# Patient Record
Sex: Female | Born: 1950 | Race: White | Hispanic: No | Smoking: Former smoker
Health system: Southern US, Community
[De-identification: ages and names within clinical notes are randomized; demographics above are authoritative.]

## PROBLEM LIST (undated history)

## (undated) DIAGNOSIS — K5909 Other constipation: Secondary | ICD-10-CM

## (undated) DIAGNOSIS — C50919 Malignant neoplasm of unspecified site of unspecified female breast: Secondary | ICD-10-CM

## (undated) DIAGNOSIS — F329 Major depressive disorder, single episode, unspecified: Secondary | ICD-10-CM

## (undated) DIAGNOSIS — Z86010 Personal history of colonic polyps: Secondary | ICD-10-CM

## (undated) DIAGNOSIS — E7800 Pure hypercholesterolemia, unspecified: Secondary | ICD-10-CM

## (undated) DIAGNOSIS — F32A Depression, unspecified: Secondary | ICD-10-CM

## (undated) HISTORY — DX: Personal history of colonic polyps: Z86.010

## (undated) HISTORY — DX: Depression, unspecified: F32.A

## (undated) HISTORY — DX: Malignant neoplasm of unspecified site of unspecified female breast: C50.919

## (undated) HISTORY — DX: Pure hypercholesterolemia, unspecified: E78.00

## (undated) HISTORY — PX: BREAST LUMPECTOMY: SHX2

## (undated) HISTORY — DX: Other constipation: K59.09

## (undated) HISTORY — PX: COLONOSCOPY: SHX174

## (undated) HISTORY — PX: ABDOMINAL HYSTERECTOMY: SHX81

---
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
## 2015-03-22 DIAGNOSIS — Z853 Personal history of malignant neoplasm of breast: Secondary | ICD-10-CM | POA: Diagnosis not present

## 2015-03-22 DIAGNOSIS — Z79811 Long term (current) use of aromatase inhibitors: Secondary | ICD-10-CM | POA: Diagnosis not present

## 2015-03-29 DIAGNOSIS — L820 Inflamed seborrheic keratosis: Secondary | ICD-10-CM | POA: Diagnosis not present

## 2015-05-13 DIAGNOSIS — H3554 Dystrophies primarily involving the retinal pigment epithelium: Secondary | ICD-10-CM | POA: Diagnosis not present

## 2015-06-04 DIAGNOSIS — R1310 Dysphagia, unspecified: Secondary | ICD-10-CM | POA: Diagnosis not present

## 2015-06-04 DIAGNOSIS — E559 Vitamin D deficiency, unspecified: Secondary | ICD-10-CM | POA: Diagnosis not present

## 2015-06-04 DIAGNOSIS — M810 Age-related osteoporosis without current pathological fracture: Secondary | ICD-10-CM | POA: Diagnosis not present

## 2015-06-04 DIAGNOSIS — E782 Mixed hyperlipidemia: Secondary | ICD-10-CM | POA: Diagnosis not present

## 2015-06-04 DIAGNOSIS — Z6829 Body mass index (BMI) 29.0-29.9, adult: Secondary | ICD-10-CM | POA: Diagnosis not present

## 2015-06-04 DIAGNOSIS — F330 Major depressive disorder, recurrent, mild: Secondary | ICD-10-CM | POA: Diagnosis not present

## 2015-06-04 DIAGNOSIS — E663 Overweight: Secondary | ICD-10-CM | POA: Diagnosis not present

## 2015-06-04 DIAGNOSIS — E538 Deficiency of other specified B group vitamins: Secondary | ICD-10-CM | POA: Diagnosis not present

## 2015-06-04 DIAGNOSIS — I10 Essential (primary) hypertension: Secondary | ICD-10-CM | POA: Diagnosis not present

## 2015-08-12 DIAGNOSIS — H43822 Vitreomacular adhesion, left eye: Secondary | ICD-10-CM | POA: Diagnosis not present

## 2015-08-12 DIAGNOSIS — H35423 Microcystoid degeneration of retina, bilateral: Secondary | ICD-10-CM | POA: Diagnosis not present

## 2015-08-12 DIAGNOSIS — H3554 Dystrophies primarily involving the retinal pigment epithelium: Secondary | ICD-10-CM | POA: Diagnosis not present

## 2015-09-02 DIAGNOSIS — H2513 Age-related nuclear cataract, bilateral: Secondary | ICD-10-CM | POA: Diagnosis not present

## 2015-09-02 DIAGNOSIS — H25041 Posterior subcapsular polar age-related cataract, right eye: Secondary | ICD-10-CM | POA: Diagnosis not present

## 2015-09-02 DIAGNOSIS — H3554 Dystrophies primarily involving the retinal pigment epithelium: Secondary | ICD-10-CM | POA: Diagnosis not present

## 2015-09-02 DIAGNOSIS — H25013 Cortical age-related cataract, bilateral: Secondary | ICD-10-CM | POA: Diagnosis not present

## 2015-09-25 DIAGNOSIS — C50212 Malignant neoplasm of upper-inner quadrant of left female breast: Secondary | ICD-10-CM | POA: Diagnosis not present

## 2015-09-25 DIAGNOSIS — Z853 Personal history of malignant neoplasm of breast: Secondary | ICD-10-CM | POA: Diagnosis not present

## 2015-09-25 DIAGNOSIS — Z79811 Long term (current) use of aromatase inhibitors: Secondary | ICD-10-CM | POA: Diagnosis not present

## 2015-09-25 DIAGNOSIS — Z170 Estrogen receptor positive status [ER+]: Secondary | ICD-10-CM | POA: Diagnosis not present

## 2015-10-22 DIAGNOSIS — J309 Allergic rhinitis, unspecified: Secondary | ICD-10-CM | POA: Diagnosis not present

## 2015-11-28 DIAGNOSIS — Z1211 Encounter for screening for malignant neoplasm of colon: Secondary | ICD-10-CM | POA: Diagnosis not present

## 2015-11-28 DIAGNOSIS — Z86010 Personal history of colonic polyps: Secondary | ICD-10-CM | POA: Diagnosis not present

## 2015-12-24 DIAGNOSIS — E782 Mixed hyperlipidemia: Secondary | ICD-10-CM | POA: Diagnosis not present

## 2015-12-24 DIAGNOSIS — Z9289 Personal history of other medical treatment: Secondary | ICD-10-CM | POA: Diagnosis not present

## 2015-12-24 DIAGNOSIS — I10 Essential (primary) hypertension: Secondary | ICD-10-CM | POA: Diagnosis not present

## 2015-12-24 DIAGNOSIS — E538 Deficiency of other specified B group vitamins: Secondary | ICD-10-CM | POA: Diagnosis not present

## 2015-12-24 DIAGNOSIS — Z23 Encounter for immunization: Secondary | ICD-10-CM | POA: Diagnosis not present

## 2015-12-24 DIAGNOSIS — M810 Age-related osteoporosis without current pathological fracture: Secondary | ICD-10-CM | POA: Diagnosis not present

## 2015-12-24 DIAGNOSIS — F330 Major depressive disorder, recurrent, mild: Secondary | ICD-10-CM | POA: Diagnosis not present

## 2015-12-24 DIAGNOSIS — E559 Vitamin D deficiency, unspecified: Secondary | ICD-10-CM | POA: Diagnosis not present

## 2015-12-24 DIAGNOSIS — Z79899 Other long term (current) drug therapy: Secondary | ICD-10-CM | POA: Diagnosis not present

## 2015-12-24 DIAGNOSIS — C50212 Malignant neoplasm of upper-inner quadrant of left female breast: Secondary | ICD-10-CM | POA: Diagnosis not present

## 2015-12-24 DIAGNOSIS — Z0000 Encounter for general adult medical examination without abnormal findings: Secondary | ICD-10-CM | POA: Diagnosis not present

## 2016-01-30 DIAGNOSIS — K219 Gastro-esophageal reflux disease without esophagitis: Secondary | ICD-10-CM | POA: Diagnosis not present

## 2016-01-30 DIAGNOSIS — Z09 Encounter for follow-up examination after completed treatment for conditions other than malignant neoplasm: Secondary | ICD-10-CM | POA: Diagnosis not present

## 2016-01-30 DIAGNOSIS — Z79899 Other long term (current) drug therapy: Secondary | ICD-10-CM | POA: Diagnosis not present

## 2016-01-30 DIAGNOSIS — K648 Other hemorrhoids: Secondary | ICD-10-CM | POA: Diagnosis not present

## 2016-01-30 DIAGNOSIS — Z87891 Personal history of nicotine dependence: Secondary | ICD-10-CM | POA: Diagnosis not present

## 2016-01-30 DIAGNOSIS — K5730 Diverticulosis of large intestine without perforation or abscess without bleeding: Secondary | ICD-10-CM | POA: Diagnosis not present

## 2016-01-30 DIAGNOSIS — I10 Essential (primary) hypertension: Secondary | ICD-10-CM | POA: Diagnosis not present

## 2016-01-30 DIAGNOSIS — Z853 Personal history of malignant neoplasm of breast: Secondary | ICD-10-CM | POA: Diagnosis not present

## 2016-01-30 DIAGNOSIS — Z1211 Encounter for screening for malignant neoplasm of colon: Secondary | ICD-10-CM | POA: Diagnosis not present

## 2016-01-30 DIAGNOSIS — E785 Hyperlipidemia, unspecified: Secondary | ICD-10-CM | POA: Diagnosis not present

## 2016-01-30 DIAGNOSIS — Z86010 Personal history of colonic polyps: Secondary | ICD-10-CM | POA: Diagnosis not present

## 2016-01-30 DIAGNOSIS — F329 Major depressive disorder, single episode, unspecified: Secondary | ICD-10-CM | POA: Diagnosis not present

## 2016-02-13 DIAGNOSIS — H3554 Dystrophies primarily involving the retinal pigment epithelium: Secondary | ICD-10-CM | POA: Diagnosis not present

## 2016-03-23 DIAGNOSIS — Z853 Personal history of malignant neoplasm of breast: Secondary | ICD-10-CM | POA: Diagnosis not present

## 2016-03-23 DIAGNOSIS — M85852 Other specified disorders of bone density and structure, left thigh: Secondary | ICD-10-CM | POA: Diagnosis not present

## 2016-03-23 DIAGNOSIS — M8589 Other specified disorders of bone density and structure, multiple sites: Secondary | ICD-10-CM | POA: Diagnosis not present

## 2016-03-23 DIAGNOSIS — C50212 Malignant neoplasm of upper-inner quadrant of left female breast: Secondary | ICD-10-CM | POA: Diagnosis not present

## 2016-03-31 DIAGNOSIS — D499 Neoplasm of unspecified behavior of unspecified site: Secondary | ICD-10-CM | POA: Diagnosis not present

## 2016-03-31 DIAGNOSIS — C50212 Malignant neoplasm of upper-inner quadrant of left female breast: Secondary | ICD-10-CM | POA: Diagnosis not present

## 2016-03-31 DIAGNOSIS — Z170 Estrogen receptor positive status [ER+]: Secondary | ICD-10-CM | POA: Diagnosis not present

## 2016-04-03 DIAGNOSIS — C50919 Malignant neoplasm of unspecified site of unspecified female breast: Secondary | ICD-10-CM | POA: Diagnosis not present

## 2016-04-03 DIAGNOSIS — Z853 Personal history of malignant neoplasm of breast: Secondary | ICD-10-CM | POA: Diagnosis not present

## 2016-04-03 DIAGNOSIS — M8580 Other specified disorders of bone density and structure, unspecified site: Secondary | ICD-10-CM | POA: Diagnosis not present

## 2016-04-03 DIAGNOSIS — Z79811 Long term (current) use of aromatase inhibitors: Secondary | ICD-10-CM | POA: Diagnosis not present

## 2016-04-03 DIAGNOSIS — Z8041 Family history of malignant neoplasm of ovary: Secondary | ICD-10-CM | POA: Diagnosis not present

## 2016-04-07 DIAGNOSIS — L820 Inflamed seborrheic keratosis: Secondary | ICD-10-CM | POA: Diagnosis not present

## 2016-04-07 DIAGNOSIS — D225 Melanocytic nevi of trunk: Secondary | ICD-10-CM | POA: Diagnosis not present

## 2016-04-07 DIAGNOSIS — L918 Other hypertrophic disorders of the skin: Secondary | ICD-10-CM | POA: Diagnosis not present

## 2016-04-07 DIAGNOSIS — L821 Other seborrheic keratosis: Secondary | ICD-10-CM | POA: Diagnosis not present

## 2016-04-28 DIAGNOSIS — Z853 Personal history of malignant neoplasm of breast: Secondary | ICD-10-CM | POA: Diagnosis not present

## 2016-06-24 DIAGNOSIS — E538 Deficiency of other specified B group vitamins: Secondary | ICD-10-CM | POA: Diagnosis not present

## 2016-06-24 DIAGNOSIS — F330 Major depressive disorder, recurrent, mild: Secondary | ICD-10-CM | POA: Diagnosis not present

## 2016-06-24 DIAGNOSIS — E782 Mixed hyperlipidemia: Secondary | ICD-10-CM | POA: Diagnosis not present

## 2016-06-24 DIAGNOSIS — Z79899 Other long term (current) drug therapy: Secondary | ICD-10-CM | POA: Diagnosis not present

## 2016-06-24 DIAGNOSIS — I10 Essential (primary) hypertension: Secondary | ICD-10-CM | POA: Diagnosis not present

## 2016-09-11 DIAGNOSIS — H35423 Microcystoid degeneration of retina, bilateral: Secondary | ICD-10-CM | POA: Diagnosis not present

## 2016-09-11 DIAGNOSIS — H35052 Retinal neovascularization, unspecified, left eye: Secondary | ICD-10-CM | POA: Diagnosis not present

## 2016-09-11 DIAGNOSIS — H43822 Vitreomacular adhesion, left eye: Secondary | ICD-10-CM | POA: Diagnosis not present

## 2016-09-11 DIAGNOSIS — H3554 Dystrophies primarily involving the retinal pigment epithelium: Secondary | ICD-10-CM | POA: Diagnosis not present

## 2016-09-29 DIAGNOSIS — H35052 Retinal neovascularization, unspecified, left eye: Secondary | ICD-10-CM | POA: Diagnosis not present

## 2016-10-12 DIAGNOSIS — Z23 Encounter for immunization: Secondary | ICD-10-CM | POA: Diagnosis not present

## 2016-10-30 DIAGNOSIS — H35423 Microcystoid degeneration of retina, bilateral: Secondary | ICD-10-CM | POA: Diagnosis not present

## 2016-10-30 DIAGNOSIS — H35052 Retinal neovascularization, unspecified, left eye: Secondary | ICD-10-CM | POA: Diagnosis not present

## 2016-10-30 DIAGNOSIS — H43822 Vitreomacular adhesion, left eye: Secondary | ICD-10-CM | POA: Diagnosis not present

## 2016-10-30 DIAGNOSIS — H3554 Dystrophies primarily involving the retinal pigment epithelium: Secondary | ICD-10-CM | POA: Diagnosis not present

## 2016-11-27 DIAGNOSIS — H35052 Retinal neovascularization, unspecified, left eye: Secondary | ICD-10-CM | POA: Diagnosis not present

## 2016-11-27 DIAGNOSIS — H35423 Microcystoid degeneration of retina, bilateral: Secondary | ICD-10-CM | POA: Diagnosis not present

## 2016-11-27 DIAGNOSIS — H3554 Dystrophies primarily involving the retinal pigment epithelium: Secondary | ICD-10-CM | POA: Diagnosis not present

## 2016-11-27 DIAGNOSIS — H43822 Vitreomacular adhesion, left eye: Secondary | ICD-10-CM | POA: Diagnosis not present

## 2016-12-24 DIAGNOSIS — M810 Age-related osteoporosis without current pathological fracture: Secondary | ICD-10-CM | POA: Diagnosis not present

## 2016-12-24 DIAGNOSIS — Z0000 Encounter for general adult medical examination without abnormal findings: Secondary | ICD-10-CM | POA: Diagnosis not present

## 2016-12-24 DIAGNOSIS — E559 Vitamin D deficiency, unspecified: Secondary | ICD-10-CM | POA: Diagnosis not present

## 2016-12-24 DIAGNOSIS — I10 Essential (primary) hypertension: Secondary | ICD-10-CM | POA: Diagnosis not present

## 2016-12-24 DIAGNOSIS — C50212 Malignant neoplasm of upper-inner quadrant of left female breast: Secondary | ICD-10-CM | POA: Diagnosis not present

## 2016-12-24 DIAGNOSIS — Z79899 Other long term (current) drug therapy: Secondary | ICD-10-CM | POA: Diagnosis not present

## 2016-12-24 DIAGNOSIS — Z23 Encounter for immunization: Secondary | ICD-10-CM | POA: Diagnosis not present

## 2016-12-24 DIAGNOSIS — E538 Deficiency of other specified B group vitamins: Secondary | ICD-10-CM | POA: Diagnosis not present

## 2016-12-24 DIAGNOSIS — E782 Mixed hyperlipidemia: Secondary | ICD-10-CM | POA: Diagnosis not present

## 2017-01-22 DIAGNOSIS — H35423 Microcystoid degeneration of retina, bilateral: Secondary | ICD-10-CM | POA: Diagnosis not present

## 2017-01-22 DIAGNOSIS — H43822 Vitreomacular adhesion, left eye: Secondary | ICD-10-CM | POA: Diagnosis not present

## 2017-01-22 DIAGNOSIS — H3554 Dystrophies primarily involving the retinal pigment epithelium: Secondary | ICD-10-CM | POA: Diagnosis not present

## 2017-01-22 DIAGNOSIS — H35052 Retinal neovascularization, unspecified, left eye: Secondary | ICD-10-CM | POA: Diagnosis not present

## 2017-02-24 DIAGNOSIS — E782 Mixed hyperlipidemia: Secondary | ICD-10-CM | POA: Diagnosis not present

## 2017-02-24 DIAGNOSIS — Z79899 Other long term (current) drug therapy: Secondary | ICD-10-CM | POA: Diagnosis not present

## 2017-03-24 DIAGNOSIS — R928 Other abnormal and inconclusive findings on diagnostic imaging of breast: Secondary | ICD-10-CM | POA: Diagnosis not present

## 2017-03-24 DIAGNOSIS — C50212 Malignant neoplasm of upper-inner quadrant of left female breast: Secondary | ICD-10-CM | POA: Diagnosis not present

## 2017-03-31 DIAGNOSIS — H35052 Retinal neovascularization, unspecified, left eye: Secondary | ICD-10-CM | POA: Diagnosis not present

## 2017-03-31 DIAGNOSIS — H3554 Dystrophies primarily involving the retinal pigment epithelium: Secondary | ICD-10-CM | POA: Diagnosis not present

## 2017-03-31 DIAGNOSIS — H43822 Vitreomacular adhesion, left eye: Secondary | ICD-10-CM | POA: Diagnosis not present

## 2017-03-31 DIAGNOSIS — H35423 Microcystoid degeneration of retina, bilateral: Secondary | ICD-10-CM | POA: Diagnosis not present

## 2017-04-05 DIAGNOSIS — Z923 Personal history of irradiation: Secondary | ICD-10-CM | POA: Diagnosis not present

## 2017-04-05 DIAGNOSIS — Z9221 Personal history of antineoplastic chemotherapy: Secondary | ICD-10-CM | POA: Diagnosis not present

## 2017-04-05 DIAGNOSIS — Z853 Personal history of malignant neoplasm of breast: Secondary | ICD-10-CM | POA: Diagnosis not present

## 2017-04-16 DIAGNOSIS — R6883 Chills (without fever): Secondary | ICD-10-CM | POA: Diagnosis not present

## 2017-04-16 DIAGNOSIS — J40 Bronchitis, not specified as acute or chronic: Secondary | ICD-10-CM | POA: Diagnosis not present

## 2017-06-23 DIAGNOSIS — H35423 Microcystoid degeneration of retina, bilateral: Secondary | ICD-10-CM | POA: Diagnosis not present

## 2017-06-23 DIAGNOSIS — H43822 Vitreomacular adhesion, left eye: Secondary | ICD-10-CM | POA: Diagnosis not present

## 2017-06-23 DIAGNOSIS — H35052 Retinal neovascularization, unspecified, left eye: Secondary | ICD-10-CM | POA: Diagnosis not present

## 2017-06-23 DIAGNOSIS — H3554 Dystrophies primarily involving the retinal pigment epithelium: Secondary | ICD-10-CM | POA: Diagnosis not present

## 2017-06-24 DIAGNOSIS — E782 Mixed hyperlipidemia: Secondary | ICD-10-CM | POA: Diagnosis not present

## 2017-06-24 DIAGNOSIS — F330 Major depressive disorder, recurrent, mild: Secondary | ICD-10-CM | POA: Diagnosis not present

## 2017-06-24 DIAGNOSIS — I10 Essential (primary) hypertension: Secondary | ICD-10-CM | POA: Diagnosis not present

## 2017-06-24 DIAGNOSIS — Z79899 Other long term (current) drug therapy: Secondary | ICD-10-CM | POA: Diagnosis not present

## 2017-11-23 DIAGNOSIS — H43822 Vitreomacular adhesion, left eye: Secondary | ICD-10-CM | POA: Diagnosis not present

## 2017-11-23 DIAGNOSIS — H35423 Microcystoid degeneration of retina, bilateral: Secondary | ICD-10-CM | POA: Diagnosis not present

## 2017-11-23 DIAGNOSIS — H3554 Dystrophies primarily involving the retinal pigment epithelium: Secondary | ICD-10-CM | POA: Diagnosis not present

## 2017-11-23 DIAGNOSIS — H35052 Retinal neovascularization, unspecified, left eye: Secondary | ICD-10-CM | POA: Diagnosis not present

## 2017-11-29 DIAGNOSIS — D2239 Melanocytic nevi of other parts of face: Secondary | ICD-10-CM | POA: Diagnosis not present

## 2017-11-29 DIAGNOSIS — L821 Other seborrheic keratosis: Secondary | ICD-10-CM | POA: Diagnosis not present

## 2017-11-29 DIAGNOSIS — D225 Melanocytic nevi of trunk: Secondary | ICD-10-CM | POA: Diagnosis not present

## 2017-11-29 DIAGNOSIS — L728 Other follicular cysts of the skin and subcutaneous tissue: Secondary | ICD-10-CM | POA: Diagnosis not present

## 2017-11-29 DIAGNOSIS — L820 Inflamed seborrheic keratosis: Secondary | ICD-10-CM | POA: Diagnosis not present

## 2018-01-04 DIAGNOSIS — M8589 Other specified disorders of bone density and structure, multiple sites: Secondary | ICD-10-CM | POA: Diagnosis not present

## 2018-01-04 DIAGNOSIS — F330 Major depressive disorder, recurrent, mild: Secondary | ICD-10-CM | POA: Diagnosis not present

## 2018-01-04 DIAGNOSIS — Z6826 Body mass index (BMI) 26.0-26.9, adult: Secondary | ICD-10-CM | POA: Diagnosis not present

## 2018-01-04 DIAGNOSIS — Z79899 Other long term (current) drug therapy: Secondary | ICD-10-CM | POA: Diagnosis not present

## 2018-01-04 DIAGNOSIS — C50212 Malignant neoplasm of upper-inner quadrant of left female breast: Secondary | ICD-10-CM | POA: Diagnosis not present

## 2018-01-04 DIAGNOSIS — E782 Mixed hyperlipidemia: Secondary | ICD-10-CM | POA: Diagnosis not present

## 2018-01-04 DIAGNOSIS — I10 Essential (primary) hypertension: Secondary | ICD-10-CM | POA: Diagnosis not present

## 2018-01-04 DIAGNOSIS — E663 Overweight: Secondary | ICD-10-CM | POA: Diagnosis not present

## 2018-01-04 DIAGNOSIS — Z0000 Encounter for general adult medical examination without abnormal findings: Secondary | ICD-10-CM | POA: Diagnosis not present

## 2018-01-04 DIAGNOSIS — E538 Deficiency of other specified B group vitamins: Secondary | ICD-10-CM | POA: Diagnosis not present

## 2018-01-26 DIAGNOSIS — R3129 Other microscopic hematuria: Secondary | ICD-10-CM | POA: Diagnosis not present

## 2018-03-01 DIAGNOSIS — H3554 Dystrophies primarily involving the retinal pigment epithelium: Secondary | ICD-10-CM | POA: Diagnosis not present

## 2018-03-01 DIAGNOSIS — H35052 Retinal neovascularization, unspecified, left eye: Secondary | ICD-10-CM | POA: Diagnosis not present

## 2018-03-01 DIAGNOSIS — H35423 Microcystoid degeneration of retina, bilateral: Secondary | ICD-10-CM | POA: Diagnosis not present

## 2018-03-01 DIAGNOSIS — H43822 Vitreomacular adhesion, left eye: Secondary | ICD-10-CM | POA: Diagnosis not present

## 2018-03-28 DIAGNOSIS — Z9889 Other specified postprocedural states: Secondary | ICD-10-CM | POA: Diagnosis not present

## 2018-03-28 DIAGNOSIS — C50212 Malignant neoplasm of upper-inner quadrant of left female breast: Secondary | ICD-10-CM | POA: Diagnosis not present

## 2018-03-28 DIAGNOSIS — R928 Other abnormal and inconclusive findings on diagnostic imaging of breast: Secondary | ICD-10-CM | POA: Diagnosis not present

## 2018-04-06 DIAGNOSIS — Z853 Personal history of malignant neoplasm of breast: Secondary | ICD-10-CM | POA: Diagnosis not present

## 2018-07-06 DIAGNOSIS — I10 Essential (primary) hypertension: Secondary | ICD-10-CM | POA: Diagnosis not present

## 2018-07-06 DIAGNOSIS — H9313 Tinnitus, bilateral: Secondary | ICD-10-CM | POA: Diagnosis not present

## 2018-07-06 DIAGNOSIS — F330 Major depressive disorder, recurrent, mild: Secondary | ICD-10-CM | POA: Diagnosis not present

## 2018-07-06 DIAGNOSIS — E663 Overweight: Secondary | ICD-10-CM | POA: Diagnosis not present

## 2018-07-06 DIAGNOSIS — R1310 Dysphagia, unspecified: Secondary | ICD-10-CM | POA: Diagnosis not present

## 2018-07-06 DIAGNOSIS — E782 Mixed hyperlipidemia: Secondary | ICD-10-CM | POA: Diagnosis not present

## 2018-07-06 DIAGNOSIS — Z79899 Other long term (current) drug therapy: Secondary | ICD-10-CM | POA: Diagnosis not present

## 2018-07-14 ENCOUNTER — Encounter: Payer: Self-pay | Admitting: Gastroenterology

## 2018-07-25 ENCOUNTER — Other Ambulatory Visit: Payer: Self-pay

## 2018-07-25 ENCOUNTER — Encounter: Payer: Self-pay | Admitting: Gastroenterology

## 2018-07-25 ENCOUNTER — Telehealth (INDEPENDENT_AMBULATORY_CARE_PROVIDER_SITE_OTHER): Payer: PPO | Admitting: Gastroenterology

## 2018-07-25 VITALS — Ht 67.0 in | Wt 165.0 lb

## 2018-07-25 DIAGNOSIS — K219 Gastro-esophageal reflux disease without esophagitis: Secondary | ICD-10-CM

## 2018-07-25 DIAGNOSIS — R1310 Dysphagia, unspecified: Secondary | ICD-10-CM

## 2018-07-25 DIAGNOSIS — R1319 Other dysphagia: Secondary | ICD-10-CM

## 2018-07-25 MED ORDER — OMEPRAZOLE 20 MG PO CPDR: DELAYED_RELEASE_CAPSULE | ORAL | 3 refills | Status: AC

## 2018-07-25 NOTE — Patient Instructions (Addendum)
If you are age 68 or older, your body mass index should be between 23-30. Your Body mass index is 25.84 kg/m. If this is out of the aforementioned range listed, please consider follow up with your Primary Care Provider.  If you are age 55 or younger, your body mass index should be between 19-25. Your Body mass index is 25.84 kg/m. If this is out of the aformentioned range listed, please consider follow up with your Primary Care Provider.   We have sent the following medications to your pharmacy for you to pick up at your convenience: Omeprazole 20 mg   You have been scheduled for a Barium Esophogram at [facility] (1st floor of the hospital) on 07/29/18 at 11am. Please arrive 15 minutes prior to your appointment for registration. Make certain not to have anything to eat or drink 3 hours prior to your test. If you need to reschedule for any reason, please contact radiology at [phone] to do so. __________________________________________________________________ A barium swallow is an examination that concentrates on views of the esophagus. This tends to be a double contrast exam (barium and two liquids which, when combined, create a gas to distend the wall of the oesophagus) or single contrast (non-ionic iodine based). The study is usually tailored to your symptoms so a good history is essential. Attention is paid during the study to the form, structure and configuration of the esophagus, looking for functional disorders (such as aspiration, dysphagia, achalasia, motility and reflux) EXAMINATION You may be asked to change into a gown, depending on the type of swallow being performed. A radiologist and radiographer will perform the procedure. The radiologist will advise you of the type of contrast selected for your procedure and direct you during the exam. You will be asked to stand, sit or lie in several different positions and to hold a small amount of fluid in your mouth before being asked  to swallow while the imaging is performed .In some instances you may be asked to swallow barium coated marshmallows to assess the motility of a solid food bolus. The exam can be recorded as a digital or video fluoroscopy procedure. POST PROCEDURE It will take 1-2 days for the barium to pass through your system. To facilitate this, it is important, unless otherwise directed, to increase your fluids for the next 24-48hrs and to resume your normal diet.  This test typically takes about 30 minutes to perform. __________________________________________________________________________________  Rose Davis have been scheduled for an endoscopy. Please follow written instructions given to you at your visit today. If you use inhalers (even only as needed), please bring them with you on the day of your procedure. Your physician has requested that you go to www.startemmi.com and enter the access code given to you at your visit today. This web site gives a general overview about your procedure. However, you should still follow specific instructions given to you by our office regarding your preparation for the procedure.  I have attached a Pre Procedure Patient Acknowledgement form and a prepaid envelope, please initial and sign form and mail back in envelope.    Thank you,  Dr. Jackquline Denmark

## 2018-07-25 NOTE — Progress Notes (Signed)
Chief Complaint: Dysphagia  Referring Provider:  Raina Mina., MD      ASSESSMENT AND PLAN;   #1. Esophageal dysphagia. Differential diagnoses includes esophageal stricture, Schatzki's ring, motility disorder, eosinophilic esophagitis, pill induced esophagitis, rule out esophageal carcinoma or extrinsic lesions.  #2. GERD  Plan: -Omeprazole 20mg  po qd, 1/2 hr before breakfast (90, 3 refills).  Call it into [facility] at [place]. -Ba swallow with lateral films. -EGD with dil at [facility].  I discussed risks and benefits. -Chew foods especially meats and breads well and eat slowly.    HPI:    Rose Davis is a 68 y.o. female  Dysphagia x 6-8 months Soft rolls (mostly solids), neck area Intermittent Occasional heartburn. No weight loss. No melena or hematochezia. Denies having any other exacerbating factors.  Denies having any abdominal pain, diarrhea or constipation.  Currently visiting her mom who is going to be 67 in aug 2020.  She recently had a fall.  Past GI procedures: -Colonoscopy 01/2016 ([facility])-mild sigmoid diverticulosis.  Otherwise normal.  Repeat in 10 years.  Earlier, if with any new problems or change in family history. Past Medical History:  Diagnosis Date  . Breast cancer ([place])   . Chronic constipation   . Depression   . History of colon polyps   . Hypercholesteremia     Past Surgical History:  Procedure Laterality Date  . ABDOMINAL HYSTERECTOMY    . BREAST LUMPECTOMY    . COLONOSCOPY      Family History  Problem Relation Age of Onset  . Colon cancer Neg Hx     Social History   Tobacco Use  . Smoking status: Never Smoker  . Smokeless tobacco: Never Used  Substance Use Topics  . Alcohol use: Never    Frequency: Never  . Drug use: Not on file    Current Outpatient Medications  Medication Sig Dispense Refill  . FLUoxetine (PROZAC) 20 MG tablet 20 mg daily.    . rosuvastatin (CRESTOR) 5 MG tablet daily.    Marland Kitchen  triamterene-hydrochlorothiazide (MAXZIDE-25) 37.5-25 MG tablet daily.    . vitamin B-12 (CYANOCOBALAMIN) 1000 MCG tablet Take by mouth.     No current facility-administered medications for this visit.     Allergies  Allergen Reactions  . Losartan   . Sulfa Antibiotics   . Zithromax [Azithromycin]     Review of Systems:  Constitutional: Denies fever, chills, diaphoresis, appetite change and fatigue.  HEENT: Denies photophobia, eye pain, redness, hearing loss, ear pain, congestion, sore throat, rhinorrhea, sneezing, mouth sores, neck pain, neck stiffness and tinnitus.   Respiratory: Denies SOB, DOE, cough, chest tightness,  and wheezing.   Cardiovascular: Denies chest pain, palpitations and leg swelling.  Genitourinary: Denies dysuria, urgency, frequency, hematuria, flank pain and difficulty urinating.  Musculoskeletal: Denies myalgias, back pain, joint swelling, arthralgias and gait problem.  Skin: No rash.  Neurological: Denies dizziness, seizures, syncope, weakness, light-headedness, numbness and headaches.  Hematological: Denies adenopathy. Easy bruising, personal or family bleeding history  Psychiatric/Behavioral: has anxiety or depression     Physical Exam:    Ht 5\' 7"  (1.702 m)   Wt 165 lb (74.8 kg)   BMI 25.84 kg/m  Filed Weights   07/25/18 0933  Weight: 165 lb (74.8 kg)   televisit  This service was provided via telemedicine.  The patient was located at [employer] home in [place], [place].  The provider was located in office.  The patient did consent to this telephone visit and is  aware of possible charges through their insurance for this visit.  The patient was referred by Dr. Bea Graff.   Time spent on call/coordination of care: 30 min  Carmell Austria, MD 07/25/2018, 10:55 AM  Cc: Raina Mina., MD

## 2018-07-25 NOTE — Addendum Note (Signed)
Addended by: Karena Addison on: 07/25/2018 11:35 AM   Modules accepted: Orders

## 2018-07-28 ENCOUNTER — Telehealth: Payer: Self-pay | Admitting: Gastroenterology

## 2018-07-28 ENCOUNTER — Other Ambulatory Visit: Payer: Self-pay

## 2018-07-28 DIAGNOSIS — K219 Gastro-esophageal reflux disease without esophagitis: Secondary | ICD-10-CM

## 2018-07-28 DIAGNOSIS — R1310 Dysphagia, unspecified: Secondary | ICD-10-CM

## 2018-07-28 DIAGNOSIS — R1319 Other dysphagia: Secondary | ICD-10-CM

## 2018-07-28 NOTE — Telephone Encounter (Signed)
Elberta Fortis from [facility] imaging called and stated that he will need for an order to be faxed over for the patient. Fax # [phone]

## 2018-07-28 NOTE — Telephone Encounter (Signed)
I have entered the order.

## 2018-07-29 ENCOUNTER — Ambulatory Visit (HOSPITAL_COMMUNITY): Payer: PPO

## 2018-08-02 ENCOUNTER — Ambulatory Visit (HOSPITAL_COMMUNITY)
Admission: RE | Admit: 2018-08-02 | Discharge: 2018-08-02 | Disposition: A | Discharge status: Home / Self Care | Payer: PPO | Source: Ambulatory Visit | Attending: Gastroenterology | Admitting: Gastroenterology

## 2018-08-02 ENCOUNTER — Other Ambulatory Visit: Payer: Self-pay

## 2018-08-02 DIAGNOSIS — R1310 Dysphagia, unspecified: Secondary | ICD-10-CM

## 2018-08-02 DIAGNOSIS — R1319 Other dysphagia: Secondary | ICD-10-CM

## 2018-08-02 DIAGNOSIS — K219 Gastro-esophageal reflux disease without esophagitis: Secondary | ICD-10-CM

## 2018-08-03 ENCOUNTER — Telehealth: Payer: Self-pay | Admitting: Gastroenterology

## 2018-08-03 DIAGNOSIS — H35052 Retinal neovascularization, unspecified, left eye: Secondary | ICD-10-CM | POA: Diagnosis not present

## 2018-08-03 DIAGNOSIS — H35371 Puckering of macula, right eye: Secondary | ICD-10-CM | POA: Diagnosis not present

## 2018-08-03 DIAGNOSIS — H31013 Macula scars of posterior pole (postinflammatory) (post-traumatic), bilateral: Secondary | ICD-10-CM | POA: Diagnosis not present

## 2018-08-03 DIAGNOSIS — H3554 Dystrophies primarily involving the retinal pigment epithelium: Secondary | ICD-10-CM | POA: Diagnosis not present

## 2018-08-03 NOTE — Telephone Encounter (Signed)
Pt stated that she is returning your call.  °

## 2018-08-03 NOTE — Progress Notes (Signed)
Left message for patient to call back to the office;  

## 2018-08-10 ENCOUNTER — Telehealth: Payer: Self-pay | Admitting: Gastroenterology

## 2018-08-10 NOTE — Telephone Encounter (Signed)
Patient called back and answered no to all questions

## 2018-08-10 NOTE — Telephone Encounter (Signed)

## 2018-08-11 ENCOUNTER — Other Ambulatory Visit: Payer: Self-pay

## 2018-08-11 ENCOUNTER — Encounter: Payer: Self-pay | Admitting: Gastroenterology

## 2018-08-11 ENCOUNTER — Ambulatory Visit (AMBULATORY_SURGERY_CENTER): Payer: PPO | Admitting: Gastroenterology

## 2018-08-11 VITALS — BP 149/75 | HR 71 | Temp 97.5°F | Resp 12 | Ht 67.0 in | Wt 165.0 lb

## 2018-08-11 DIAGNOSIS — K228 Other specified diseases of esophagus: Secondary | ICD-10-CM | POA: Diagnosis not present

## 2018-08-11 DIAGNOSIS — K449 Diaphragmatic hernia without obstruction or gangrene: Secondary | ICD-10-CM

## 2018-08-11 DIAGNOSIS — B9681 Helicobacter pylori [H. pylori] as the cause of diseases classified elsewhere: Secondary | ICD-10-CM

## 2018-08-11 DIAGNOSIS — K3189 Other diseases of stomach and duodenum: Secondary | ICD-10-CM | POA: Diagnosis not present

## 2018-08-11 DIAGNOSIS — K219 Gastro-esophageal reflux disease without esophagitis: Secondary | ICD-10-CM

## 2018-08-11 DIAGNOSIS — K2950 Unspecified chronic gastritis without bleeding: Secondary | ICD-10-CM | POA: Diagnosis not present

## 2018-08-11 DIAGNOSIS — I10 Essential (primary) hypertension: Secondary | ICD-10-CM | POA: Diagnosis not present

## 2018-08-11 DIAGNOSIS — R1310 Dysphagia, unspecified: Secondary | ICD-10-CM | POA: Diagnosis not present

## 2018-08-11 DIAGNOSIS — K2980 Duodenitis without bleeding: Secondary | ICD-10-CM

## 2018-08-11 DIAGNOSIS — R1319 Other dysphagia: Secondary | ICD-10-CM

## 2018-08-11 MED ORDER — OMEPRAZOLE 20 MG PO CPDR: 20.0000 mg | DELAYED_RELEASE_CAPSULE | Freq: Every day | ORAL | 3 refills | Status: DC

## 2018-08-11 MED ORDER — SODIUM CHLORIDE 0.9 % IV SOLN: 500.0000 mL | Freq: Once | INTRAVENOUS | Status: DC

## 2018-08-11 NOTE — Progress Notes (Signed)
Called to room to assist during endoscopic procedure.  Patient ID and intended procedure confirmed with present staff. Received instructions for my participation in the procedure from the performing physician.  

## 2018-08-11 NOTE — Patient Instructions (Signed)
Discharge instructions given. Handouts on a dilatation diet,Gastritis,hiatal hernia. Resume previous medications. YOU HAD AN ENDOSCOPIC PROCEDURE TODAY AT [facility]:   Refer to the procedure report that was given to you for any specific questions about what was found during the examination.  If the procedure report does not answer your questions, please call your gastroenterologist to clarify.  If you requested that your care partner not be given the details of your procedure findings, then the procedure report has been included in a sealed envelope for you to review at your convenience later.  YOU SHOULD EXPECT: Some feelings of bloating in the abdomen. Passage of more gas than usual.  Walking can help get rid of the air that was put into your GI tract during the procedure and reduce the bloating. If you had a lower endoscopy (such as a colonoscopy or flexible sigmoidoscopy) you may notice spotting of blood in your stool or on the toilet paper. If you underwent a bowel prep for your procedure, you may not have a normal bowel movement for a few days.  Please Note:  You might notice some irritation and congestion in your nose or some drainage.  This is from the oxygen used during your procedure.  There is no need for concern and it should clear up in a day or so.  SYMPTOMS TO REPORT IMMEDIATELY:   Following upper endoscopy (EGD)  Vomiting of blood or coffee ground material  New chest pain or pain under the shoulder blades  Painful or persistently difficult swallowing  New shortness of breath  Fever of 100F or higher  Black, tarry-looking stools  For urgent or emergent issues, a gastroenterologist can be reached at any hour by calling [phone].   DIET:  We do recommend a small meal at first, but then you may proceed to your regular diet.  Drink plenty of fluids but you should avoid alcoholic beverages for 24 hours.  ACTIVITY:  You should plan to take it easy for the  rest of today and you should NOT DRIVE or use heavy machinery until tomorrow (because of the sedation medicines used during the test).    FOLLOW UP: Our staff will call the number listed on your records 48-72 hours following your procedure to check on you and address any questions or concerns that you may have regarding the information given to you following your procedure. If we do not reach you, we will leave a message.  We will attempt to reach you two times.  During this call, we will ask if you have developed any symptoms of COVID 19. If you develop any symptoms (ie: fever, flu-like symptoms, shortness of breath, cough etc.) before then, please call [phone].  If you test positive for Covid 19 in the 2 weeks post procedure, please call and report this information to [place].    If any biopsies were taken you will be contacted by phone or by letter within the next 1-3 weeks.  Please call us at [phone] if you have not heard about the biopsies in 3 weeks.    SIGNATURES/CONFIDENTIALITY: You and/or your care partner have signed paperwork which will be entered into your electronic medical record.  These signatures attest to the fact that that the information above on your After Visit Summary has been reviewed and is understood.  Full responsibility of the confidentiality of this discharge information lies with you and/or your care-partner.

## 2018-08-11 NOTE — Progress Notes (Signed)
Riki Sheer, [place], CMA - vs   Pt's states no medical or surgical changes since previsit or office visit.

## 2018-08-11 NOTE — Progress Notes (Signed)
Report to PACU, RN, vss, BBS= Clear.  

## 2018-08-11 NOTE — Op Note (Signed)
[facility] Patient Name: Rose Davis Procedure Date: 08/11/2018 8:29 AM MRN: [id number] Endoscopist: Jackquline Denmark , MD Age: 68 Referring MD:  Date of Birth: [birth year] Gender: Female Account #: [id number] Procedure:                Upper GI endoscopy Indications:              #1. Esophageal dysphagia with Ba swallow showing                            distal esophageal stricture/ring                           #2. GERD Medicines:                Monitored Anesthesia Care Procedure:                Pre-Anesthesia Assessment:                           - Prior to the procedure, a History and Physical                            was performed, and patient medications and                            allergies were reviewed. The patient's tolerance of                            previous anesthesia was also reviewed. The risks                            and benefits of the procedure and the sedation                            options and risks were discussed with the patient.                            All questions were answered, and informed consent                            was obtained. Prior Anticoagulants: The patient has                            taken no previous anticoagulant or antiplatelet                            agents. ASA Grade Assessment: II - A patient with                            mild systemic disease. After reviewing the risks                            and benefits, the patient was deemed in  satisfactory condition to undergo the procedure.                           After obtaining informed consent, the endoscope was                            passed under direct vision. Throughout the                            procedure, the patient's blood pressure, pulse, and                            oxygen saturations were monitored continuously. The                            Endoscope was introduced through the mouth, and              advanced to the second part of duodenum. The upper                            GI endoscopy was accomplished without difficulty.                            The patient tolerated the procedure well. Scope In: Scope Out: Findings:                 One benign-appearing, intrinsic mild stenosis was                            found 38 cm from the incisors. The stenosis was                            traversed. The scope was withdrawn. Dilation was                            performed with a Maloney dilator with mild                            resistance at 50 Fr. Estimated blood loss: none.                           The Z-line was irregular and was found 38 cm from                            the incisors. Biopsies were taken with a cold                            forceps for histology from all 4 quadrants directed                            by NBI. Estimated blood loss: none.                           A small hiatal hernia was present.  Localized mild inflammation characterized by                            erythema was found in the gastric body and in the                            gastric antrum. Biopsies were taken with a cold                            forceps for histology. Pylorus was mildly deformed                            suggestive of previous peptic ulcer disease.                           Deformed first portion of the duodenum without                            obstruction with patchy mildly erythematous mucosa                            without active bleeding and with no stigmata of                            bleeding was found in the duodenal bulb and in the                            first portion of the duodenum. Biopsies were taken                            with a cold forceps for histology. Second portion                            of the duodenum was normal. Complications:            No immediate complications. Estimated Blood Loss:      Estimated blood loss: none. Impression:               - Wide open distal esophageal stricture s/p                            esophageal dilatation.                           - Z-line irregular, 38 cm from the incisors.                            Biopsied.                           - Small hiatal hernia.                           - Mild gastritis with deformed pylorus s/o previous  peptic ulcer disease. No active ulcers. No outlet                            obstruction.                           - Mild duodenitis. Recommendation:           - Patient has a contact number available for                            emergencies. The signs and symptoms of potential                            delayed complications were discussed with the                            patient. Return to normal activities tomorrow.                            Written discharge instructions were provided to the                            patient.                           - Postdilatation diet.                           - Continue omeprazole 20 mg p.o. once a day. #30,                            11 refills.                           - Await pathology results.                           - Avoid nonsteroidals.                           - Return to GI clinic PRN.                           - Chew food especially meats and breads well and                            eat slowly. Jackquline Denmark, MD 08/11/2018 8:59:04 AM This report has been signed electronically.

## 2018-08-26 ENCOUNTER — Encounter: Payer: Self-pay | Admitting: Gastroenterology

## 2018-08-29 ENCOUNTER — Telehealth: Payer: Self-pay

## 2018-08-29 DIAGNOSIS — A048 Other specified bacterial intestinal infections: Secondary | ICD-10-CM

## 2018-08-29 MED ORDER — CLARITHROMYCIN 250 MG PO TABS: 500.0000 mg | ORAL_TABLET | Freq: Two times a day (BID) | ORAL | 0 refills | Status: AC

## 2018-08-29 MED ORDER — AMOXICILLIN 500 MG PO TABS: 1000.0000 mg | ORAL_TABLET | Freq: Two times a day (BID) | ORAL | 0 refills | Status: AC

## 2018-08-29 MED ORDER — METRONIDAZOLE 500 MG PO TABS: 500.0000 mg | ORAL_TABLET | Freq: Two times a day (BID) | ORAL | 0 refills | Status: AC

## 2018-08-29 MED ORDER — AMOXICILLIN 500 MG PO TABS: 1000.0000 mg | ORAL_TABLET | Freq: Two times a day (BID) | ORAL | 0 refills | Status: DC

## 2018-08-29 MED ORDER — OMEPRAZOLE 20 MG PO CPDR: 20.0000 mg | DELAYED_RELEASE_CAPSULE | Freq: Two times a day (BID) | ORAL | 0 refills | Status: AC

## 2018-08-29 MED ORDER — METRONIDAZOLE 500 MG PO TABS: 500.0000 mg | ORAL_TABLET | Freq: Two times a day (BID) | ORAL | 0 refills | Status: DC

## 2018-08-29 MED ORDER — OMEPRAZOLE 20 MG PO CPDR: 20.0000 mg | DELAYED_RELEASE_CAPSULE | Freq: Two times a day (BID) | ORAL | 0 refills | Status: DC

## 2018-08-29 MED ORDER — CLARITHROMYCIN 250 MG PO TABS: 500.0000 mg | ORAL_TABLET | Freq: Two times a day (BID) | ORAL | 0 refills | Status: DC

## 2018-08-29 NOTE — Telephone Encounter (Signed)
Called and spoke with patient-patient informed of result note and MD recommendations; patient is agreeable with plan of care; Patient verbalized understanding of information/instructions;  Patient was advised to call the office at [phone] if questions/concerns arise; Rx reordered and sent to pharmacy of patient choice (closer to home);

## 2018-08-29 NOTE — Telephone Encounter (Signed)
Left message for patient to call back;   RX sent in per MD orders to [facility]; Lab order also placed in Epic;

## 2018-08-29 NOTE — Telephone Encounter (Signed)
Pt returning your call

## 2018-08-29 NOTE — Telephone Encounter (Signed)
-----   Message from Jackquline Denmark, MD sent at 08/26/2018  1:24 PM EDT ----- Bre can you let the patient know gastric biopsies are positive for H pylori. Would recommend the following treatment regimen for 14 days: Amoxicillin 1gm BID Clarithromycin 500mg  BID Flagyl 500mg  BID Omeprazole 20 mg BID   I know she has allergies mentioned as azithromycin.  I do not believe it is true allergy.  She should be able to tolerate Biaxin.  Can you please order this if no signfiicant interactions noted. Once done with therapy, the patient should wait one month and perform a stool study for H pylori antigen to ensure negative. The PPI needs to be held at least 2 weeks prior to submitting the stool sample. The patient should avoid alcohol while taking Flagyl.   Thanks  Dr Lyndel Safe

## 2018-08-30 ENCOUNTER — Other Ambulatory Visit: Payer: PPO

## 2018-08-31 ENCOUNTER — Telehealth: Payer: Self-pay | Admitting: Gastroenterology

## 2018-08-31 NOTE — Telephone Encounter (Signed)
Called and spoke with patient-patient is questioning whether she can take Tylenol with "all of the other medications the doctor put me on for this infection"; patient advised that a headache relief medication would be acceptable to take as long as the medication is taken with food to minimize gi upset associated with antibiotic therapy; patient verbalized understanding of instructions/information; patient also advised to call back to the office should questions/concerns arise;

## 2018-09-01 ENCOUNTER — Telehealth: Payer: Self-pay | Admitting: Gastroenterology

## 2018-09-01 DIAGNOSIS — R112 Nausea with vomiting, unspecified: Secondary | ICD-10-CM

## 2018-09-01 MED ORDER — ONDANSETRON 4 MG PO TBDP: 4.0000 mg | ORAL_TABLET | ORAL | 0 refills | Status: AC | PRN

## 2018-09-01 NOTE — Telephone Encounter (Signed)
Bre, Please find out how many days of antibiotics she has taken already. Can cut Flagyl down to half the actual dose.  If she still does not tolerate, can stop Flagyl but continue rest of the medications as prescribed if possible. Give her Zofran 4 mg ODT every 4-6 hours as needed, #20.  Thx RG

## 2018-09-01 NOTE — Telephone Encounter (Signed)
Patient reports she started taking the medication on Tuesday pm-  Patient was informed of MD recommendations and is agreeable to plan of care;   RX submitted to pharmacy of choice; patient advised to call back should questions/concerns arise; patient verbalized understanding of information/instructions;

## 2018-09-01 NOTE — Telephone Encounter (Signed)
Called and spoke with patient-patient reports directly after taking the Flagyl she has thrown up and has not been able to eat without feeling nauseated-still feeling nauseated -pt also reports mouth is really dry- 7:00am-Amoxicllin, 10:00am-Metronidazole 11:30am-Biaxin;  Please advise if the patient needs to continue taking the medication with the assistance of an antiemetic medication;

## 2018-09-03 ENCOUNTER — Telehealth: Payer: Self-pay | Admitting: Gastroenterology

## 2018-09-03 MED ORDER — NYSTATIN 100000 UNIT/ML MT SUSP: 5.0000 mL | Freq: Four times a day (QID) | OROMUCOSAL | 0 refills | Status: AC

## 2018-09-03 NOTE — Telephone Encounter (Signed)
Patient called complaining of thrush.  Being treated for Hpylori and has sore mouth with white coating.  Has been treated with mouthwash for thrush in the past.  Nystatin swish and swallow sent to her pharmacy.  I have asked her to contact us with update sometime next week.

## 2018-09-05 NOTE — Telephone Encounter (Signed)
Jess Thanks a lot  Bre,  Can you pl call her and see how she is? Thx RG

## 2018-09-06 ENCOUNTER — Telehealth: Payer: Self-pay | Admitting: Gastroenterology

## 2018-09-06 NOTE — Telephone Encounter (Signed)
Called and spoke with patient-patient reports she has developed headache (comes and goes) since being on these antibiotics-mouth is more red than normal/not blistered but "feels raw in some places"; patient feels she might be a little paranoid about her mouth healing but wanted to make sure Dr. Lyndel Safe knew of symptoms; low grade fever 99.1/99.5 still after Tylenol which also helps the headaches twinges of pain; Patient denies diarrhea-patient is feeling a little better than she did yesterday   would like on advice=if it is normal to have redness and raw feeling in her mouth and if the headaches (random twinges of pain) are normal?  Patient advised she can use throat lozenges for the dry mouth if she is able to tolerate them with the mouth soreness; patient also advised to increase fluid intake to help with dry mouth as well;

## 2018-09-06 NOTE — Telephone Encounter (Signed)
Please see additional documentation concerning this patient 

## 2018-09-06 NOTE — Telephone Encounter (Signed)
Extremely hard to take medications for H. Pylori. Some oral soreness is expected.  She should almost be done with antibiotics.  Once she is done, she will feel so much better.  Please call her.  Let me know how she is doing.  RG

## 2018-09-07 NOTE — Telephone Encounter (Signed)
Called and spoke with patient-patient reports she is doing much better that she has been; patient reports she will update the office if she is NOT feeling better once the antibiotics are finished; patient reports she has been consuming Popsicles, jello, soup in order to have relief from oral soreness;  Patient advised to call back if symptoms worsen or do not improve after finishing antibx;

## 2018-09-19 ENCOUNTER — Telehealth: Payer: Self-pay | Admitting: Gastroenterology

## 2018-09-19 NOTE — Telephone Encounter (Signed)
Pt reported that she has finished her medication but her mouth and tongue are still inflamed.  She is concerned about a yeast infection.  Please advise.

## 2018-09-19 NOTE — Telephone Encounter (Signed)
Called and spoke to patient-patient is concerned that the antibx she was on has caused her to begin having a yeast infection-and that her mouth redness and soreness has not improved -mouth is very sore, her gums are really dark red, tongue is really sore; patient denies fever, however, last antibx medication was 09/13/2018-was hoping to have seen improvement since then but has not had any relief Please advise

## 2018-09-20 NOTE — Telephone Encounter (Signed)
Needs to be seen by Blanch Media or Dr Bea Graff first (PCP). Not comfortable just treating her with antifungals again at this time. Can you please arrange above.  Thx  RG

## 2018-09-21 NOTE — Telephone Encounter (Signed)
Called and spoke with patient-advised patient of MD recommendations and patient is agreeable to call her PCP and receive information from them; patient advised to call back to the office should questions/concerns arise; patient verbalized understanding of information/instructions;

## 2018-09-22 DIAGNOSIS — B373 Candidiasis of vulva and vagina: Secondary | ICD-10-CM | POA: Diagnosis not present

## 2018-09-22 DIAGNOSIS — B370 Candidal stomatitis: Secondary | ICD-10-CM | POA: Diagnosis not present

## 2018-09-28 ENCOUNTER — Other Ambulatory Visit: Payer: PPO

## 2018-09-28 DIAGNOSIS — A048 Other specified bacterial intestinal infections: Secondary | ICD-10-CM

## 2018-10-01 LAB — H. PYLORI ANTIGEN, STOOL: H pylori Ag, Stl: NEGATIVE

## 2018-10-05 ENCOUNTER — Telehealth: Payer: Self-pay | Admitting: Gastroenterology

## 2018-10-06 ENCOUNTER — Other Ambulatory Visit: Payer: Self-pay

## 2018-10-06 DIAGNOSIS — A048 Other specified bacterial intestinal infections: Secondary | ICD-10-CM

## 2018-10-07 MED ORDER — OMEPRAZOLE 20 MG PO CPDR: 20.0000 mg | DELAYED_RELEASE_CAPSULE | Freq: Every day | ORAL | 3 refills | Status: AC

## 2018-10-07 NOTE — Telephone Encounter (Signed)
I have called this patient and left message for more information on which pharmacy she wants to use.

## 2018-10-07 NOTE — Telephone Encounter (Signed)
I have spoke to patient and sent prescription to patients requested pharmacy.

## 2020-08-31 IMAGING — RF ESOPHAGUS/BARIUM SWALLOW/TABLET STUDY
8 series · 24 of 24 positions shown · non-contrast
Comparison: CT a chest from August 23, 2011.

CLINICAL DATA: Esophageal dysphagia.

EXAM:
ESOPHOGRAM / BARIUM SWALLOW / BARIUM TABLET STUDY
TECHNIQUE: Combined double contrast and single contrast examination performed
using effervescent crystals, thick barium liquid, and thin barium
liquid. The patient was observed with fluoroscopy swallowing a 13 mm
barium sulphate tablet.
FLUOROSCOPY TIME:  Fluoroscopy Time:  1 minutes, 48 seconds
Radiation Exposure Index (if provided by the fluoroscopic device):
14.8 mGy
Number of Acquired Spot Images: 0

[Series 1: cp_standard · 0.35mm/px · 3 of 48 frames shown (1 of 8)]
[frame 8/48]
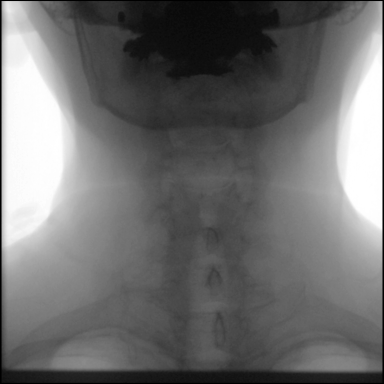
[frame 25/48]
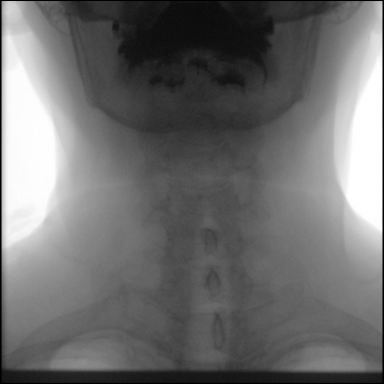
[frame 41/48]
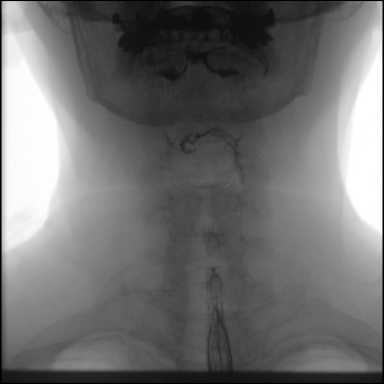

[Series 2: cp_standard · 0.35mm/px · 3 of 34 frames shown (2 of 8)]
[frame 6/34]
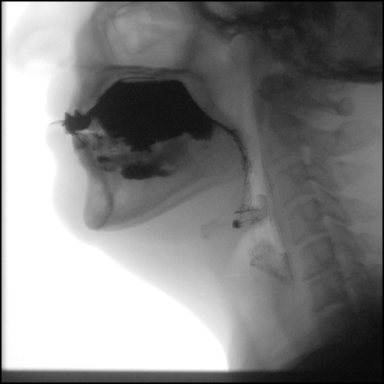
[frame 18/34]
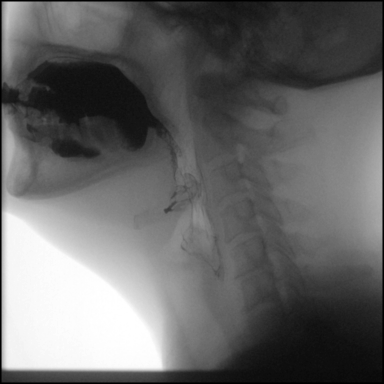
[frame 34/34]
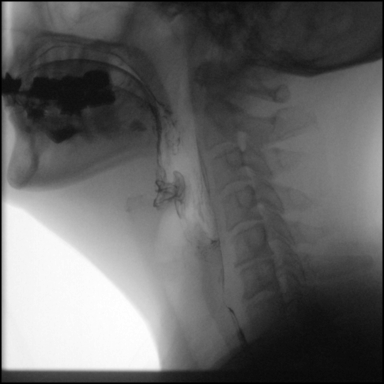

[Series 3: cp_standard · 0.35mm/px · 3 of 52 frames shown (3 of 8)]
[frame 7/52]
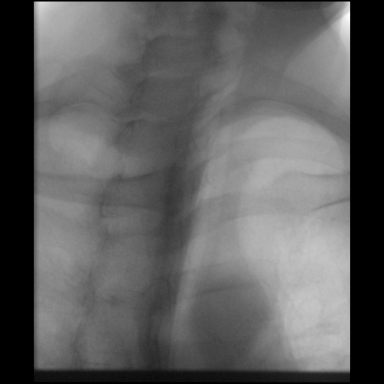
[frame 8/52]
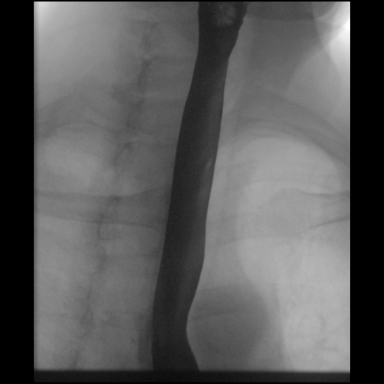
[frame 45/52]
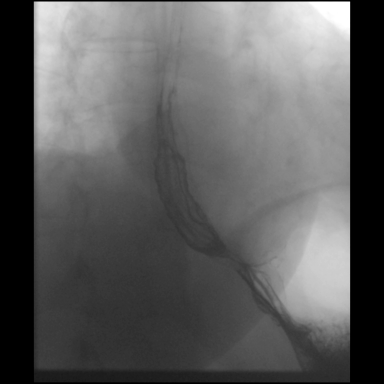

[Series 4: cp_standard · 0.34mm/px · 3 of 40 frames shown (4 of 8)]
[frame 7/40]
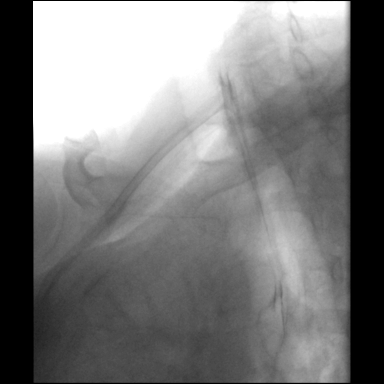
[frame 21/40]
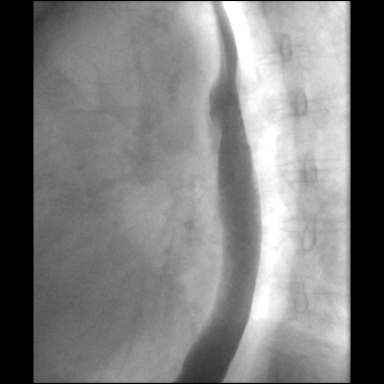
[frame 40/40]
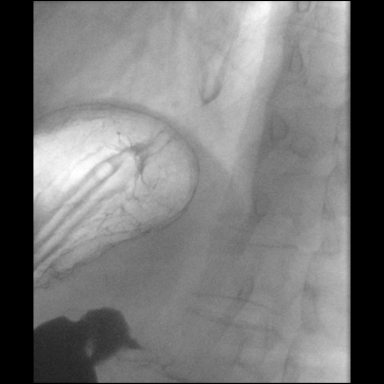

[Series 5: cp_standard · 0.34mm/px · 3 of 40 frames shown (5 of 8)]
[frame 7/40]
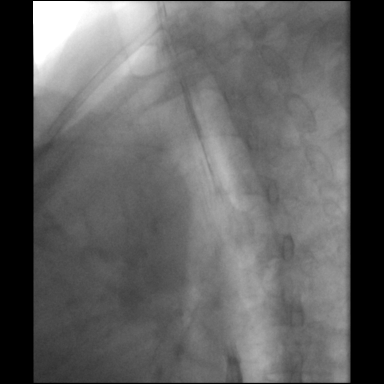
[frame 21/40]
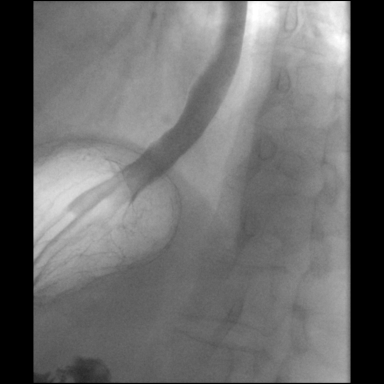
[frame 35/40]
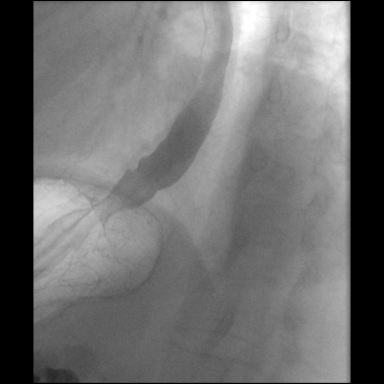

[Series 6: cp_standard · 0.34mm/px · 3 of 46 frames shown (6 of 8)]
[frame 7/46]
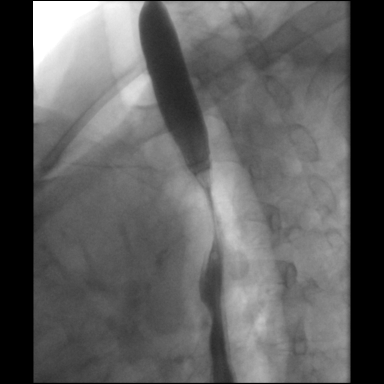
[frame 33/46]
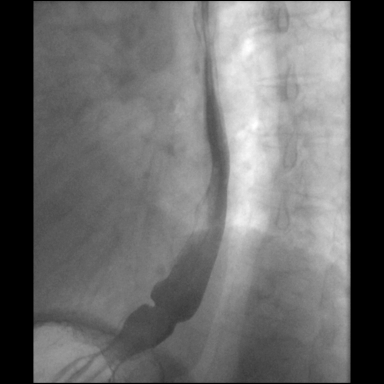
[frame 40/46]
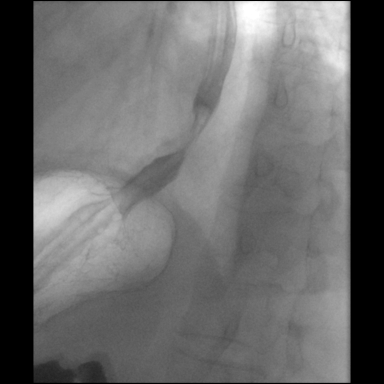

[Series 7: cp_standard · 0.34mm/px · 3 of 53 frames shown (7 of 8)]
[frame 8/53]
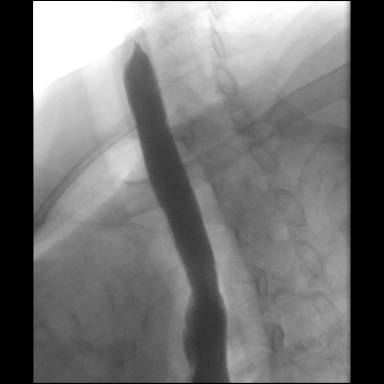
[frame 46/53]
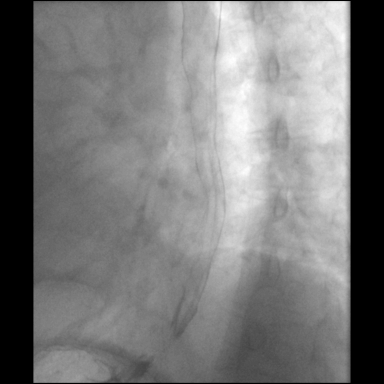
[frame 48/53]
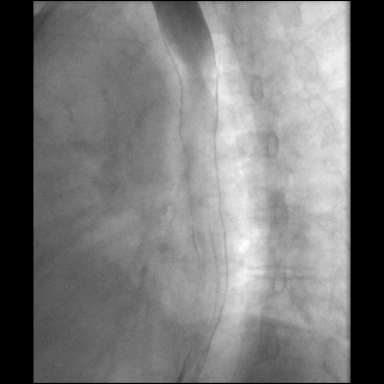

[Series 8: cp_standard · 0.34mm/px · 3 of 10 frames shown (8 of 8)]
[frame 2/10]
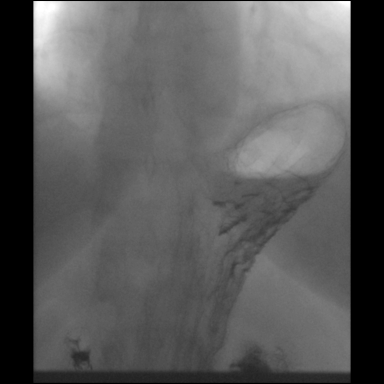
[frame 6/10]
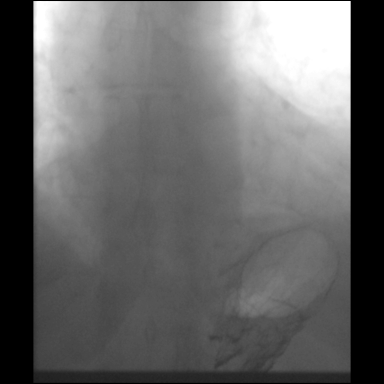
[frame 9/10]
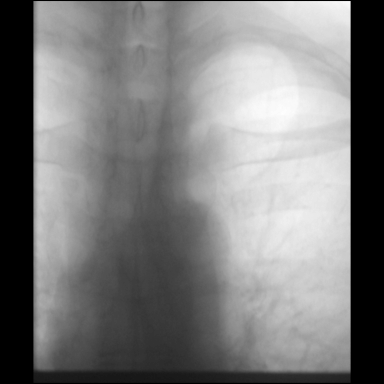

[24 of 24 positions shown; findings below may reference images not displayed]

FINDINGS: The pharyngeal phase of swallowing appears normal.

Mild distal esophageal fold accentuation which can being countered
in setting of distal esophagitis. No ulceration or definite erosions
identified on double contrast imaging.

On primary peristaltic waves, there was proximal escape on [DATE]
swallows but otherwise continuous primary peristaltic waves.

In the distal esophagus, a distal esophageal mucosal ring (Schatzki
ring) is present measuring approximately 1.2 cm in maximum distended
diameter.

However, a 1.3 cm barium tablet passed beyond this Schatzki ring
with only transient delay.

No other esophageal narrowings are identified.
IMPRESSION: 1. Distal esophageal mucosal ring (Schatzki ring) measuring 1.2 cm
in maximum distended diameter. However, a 1.3 cm barium tablet did
pass beyond this mucosal ring with only transient delay. Distal
esophageal mucosal rings of this size frequently cause symptoms.
2. Mild distal esophageal fold accentuation which can be encountered
in the setting of mild moderate distal esophagitis. No ulceration or
definite erosions.
3. Borderline appearance for dysmotility with proximal escape on [DATE]
swallows but otherwise continuous primary peristaltic waves.
